# Patient Record
Sex: Female | Born: 2005 | Race: White | Hispanic: Yes | Marital: Single | Smoking: Never smoker
Health system: Southern US, Community
[De-identification: ages and names within clinical notes are randomized; demographics above are authoritative.]

## PROBLEM LIST (undated history)

## (undated) DIAGNOSIS — K5900 Constipation, unspecified: Secondary | ICD-10-CM

## (undated) HISTORY — DX: Constipation, unspecified: K59.00

---
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
## 2024-01-19 ENCOUNTER — Other Ambulatory Visit: Payer: Self-pay

## 2024-01-19 ENCOUNTER — Encounter (HOSPITAL_BASED_OUTPATIENT_CLINIC_OR_DEPARTMENT_OTHER): Payer: Self-pay

## 2024-01-19 ENCOUNTER — Emergency Department (HOSPITAL_BASED_OUTPATIENT_CLINIC_OR_DEPARTMENT_OTHER)
Admission: EM | Admit: 2024-01-19 | Discharge: 2024-01-19 | Disposition: A | Discharge status: Home / Self Care | Payer: Self-pay | Attending: Emergency Medicine | Admitting: Emergency Medicine

## 2024-01-19 DIAGNOSIS — M545 Low back pain, unspecified: Secondary | ICD-10-CM | POA: Insufficient documentation

## 2024-01-19 MED ORDER — CYCLOBENZAPRINE HCL 10 MG PO TABS
10.0000 mg | ORAL_TABLET | Freq: Once | ORAL | Status: AC
  Administered 2024-01-19: 10 mg via ORAL
  Filled 2024-01-19: qty 1

## 2024-01-19 MED ORDER — CELECOXIB 200 MG PO CAPS: 200.0000 mg | ORAL_CAPSULE | Freq: Two times a day (BID) | ORAL | 0 refills | Status: AC | PRN

## 2024-01-19 MED ORDER — KETOROLAC TROMETHAMINE 30 MG/ML IJ SOLN
30.0000 mg | Freq: Once | INTRAMUSCULAR | Status: AC
  Administered 2024-01-19: 30 mg via INTRAMUSCULAR
  Filled 2024-01-19: qty 1

## 2024-01-19 MED ORDER — CYCLOBENZAPRINE HCL 10 MG PO TABS: 10.0000 mg | ORAL_TABLET | Freq: Two times a day (BID) | ORAL | 0 refills | Status: AC | PRN

## 2024-01-19 NOTE — Discharge Instructions (Addendum)
 As discussed, I suspect that your back pain is likely muscular in etiology.  Will send you home with an anti-inflammatory as well as a muscle relaxer to use for your symptoms.  Note the muscle laxer can cause drowsiness so please do not drive or perform any high risk activity and utilize its effects on you.  See information attached your discharge papers regarding stretching exercises.  Recommend follow-up with your primary care for reassessment of your symptoms.  Please do not hesitate to return to emergency department if the worrisome signs and symptoms we discussed become apparent.

## 2024-01-19 NOTE — ED Notes (Signed)
 Pt to rm 5 with steady gait for worsening back pain that started this morning after coughing. Denies numbness/tingling to all extremities. No loss of bowel/bladder function.

## 2024-01-19 NOTE — ED Provider Notes (Signed)
 [place] EMERGENCY DEPARTMENT AT [facility] Provider Note   CSN: [id number] Arrival date & time: 01/19/24  2237     Patient presents with: Back Pain   Tiffany Harper is a 18 y.o. female.    Back Pain   18 year old female presents emergency department complaint of low back pain.  States that she was brushing her teeth and coughed causing a spasming type sensation in her low back.  States that she feels this more in her left side than her right side.  Denies any subsequent trauma/falls.  Denies any saddle anesthesia, bowel/bladder dysfunction, weakness/sensory deficits of lower extremities, fever, history of IV drug use, known malignancy, chronic corticosteroid use.  States that symptoms just occurred prior to arrival.  Has taken nothing for his symptoms.  Patient states that she is currently on her menstrual cycle and is not concerned for pregnancy.  Denies any abdominal pain, urinary symptoms.  Past medical history significant for eczema, chronic left patient  Prior to Admission medications   Medication Sig Start Date End Date Taking? Authorizing Provider  polyethylene glycol powder (GLYCOLAX /MIRALAX ) powder Take 1 capful dissolved in 8-12 ounces of clear, non-carbonated liquid (Water, Gatorade) by mouth daily. May titrate dose, as needed, for effect. 11/12/16   Jakie Mariel Boon, NP    Allergies: Patient has no known allergies.    Review of Systems  Musculoskeletal:  Positive for back pain.  All other systems reviewed and are negative.   Updated Vital Signs BP 125/66 (BP Location: Right Arm)   Pulse 66   Temp 98.4 F (36.9 C) (Oral)   Resp 16   Ht 5' 3 (1.6 m)   Wt 59.9 kg   LMP 01/17/2024 (Exact Date)   SpO2 100%   BMI 23.38 kg/m   Physical Exam Vitals and nursing note reviewed.  Constitutional:      General: She is not in acute distress.    Appearance: She is well-developed.  HENT:     Head: Normocephalic and atraumatic.  Eyes:      Conjunctiva/sclera: Conjunctivae normal.  Cardiovascular:     Rate and Rhythm: Normal rate and regular rhythm.     Heart sounds: No murmur heard. Pulmonary:     Effort: Pulmonary effort is normal. No respiratory distress.     Breath sounds: Normal breath sounds. No wheezing, rhonchi or rales.  Abdominal:     Palpations: Abdomen is soft.     Tenderness: There is no abdominal tenderness. There is no guarding.  Musculoskeletal:        General: No swelling.     Cervical back: Neck supple.     Comments: No midline tenderness lumbar spine.  Paraspinal tenderness left greater than right lower lumbar/buttock region.  Muscular strength 5 out of 5 lower extremities.  No sensory deficits along major nerve attributions of lower extremities.  DTRs symmetric at patella as well as Achilles.  Pedal and posttibial pulses 2+ bilaterally.  Skin:    General: Skin is warm and dry.     Capillary Refill: Capillary refill takes less than 2 seconds.  Neurological:     Mental Status: She is alert.  Psychiatric:        Mood and Affect: Mood normal.     (all labs ordered are listed, but only abnormal results are displayed) Labs Reviewed - No data to display  EKG: None  Radiology: No results found.   Procedures   Medications Ordered in the ED  ketorolac  (TORADOL ) 30 MG/ML injection 30  mg (has no administration in time range)  cyclobenzaprine  (FLEXERIL ) tablet 10 mg (has no administration in time range)                                    Medical Decision Making Risk Prescription drug management.   This patient presents to the ED for concern of back pain, this involves an extensive number of treatment options, and is a complaint that carries with it a high risk of complications and morbidity.  The differential diagnosis includes strain/pain, dislocation, AAA, aortic dissection, pyonephritis, nephrolithiasis, fracture, other   Co morbidities that complicate the patient evaluation  See  HPI   Additional history obtained:  Additional history obtained from EMR External records from outside source obtained and reviewed including [facility] records   Lab Tests:  N/a   Imaging Studies ordered:  N/a   Cardiac Monitoring: / EKG:  N/a   Consultations Obtained:  N/a   Problem List / ED Course / Critical interventions / Medication management  Low back pain I ordered medication including Toradol , Flexeril    Reevaluation of the patient after these medicines showed that the patient improved I have reviewed the patients home medicines and have made adjustments as needed   Social Determinants of Health:  Denies tobacco, licit drug use.   Test / Admission - Considered:  Low back pain Vitals signs within normal range and stable throughout visit. 18 year old female presents emergency department complaint of low back pain.  States that she was brushing her teeth and coughed causing a spasming type sensation in her low back.  States that she feels this more in her left side than her right side.  Denies any subsequent trauma/falls.  Denies any saddle anesthesia, bowel/bladder dysfunction, weakness/sensory deficits of lower extremities, fever, history of IV drug use, known malignancy, chronic corticosteroid use.  States that symptoms just occurred prior to arrival.  Has taken nothing for his symptoms.  Patient states that she is currently on her menstrual cycle and is not concerned for pregnancy.  Denies any abdominal pain, urinary symptoms. On exam, paraspinal tenderness left greater than right lower lumbar/buttock region.  No red flag signs or back pain on HPI/PE; low suspicion for cauda equina, spinal epidural abscess, other spinal cord compression/impingement.  Patient without abdominal pain rating to back, pulse deficits, neurodeficits; low suspicion for aortic dissection.  Patient without any urinary symptoms, intra-abdominal radiation of pain; low suspicion for  pyelonephritis, nephrolithiasis.  Suspect muscular etiology of symptoms.  Treated with medications and did note improvement of symptoms on the ED.  Will treat with similar medications in the outpatient setting as well as recommend rehab exercises.  Close follow-up with PCP recommended for reevaluation.  Treatment plan discussed with patient and treatment understanding was agreeable to said plan.  Patient overall well-appearing, afebrile in no acute distress. Worrisome signs and symptoms were discussed with the patient, and the patient acknowledged understanding to return to the ED if noticed. Patient was stable upon discharge.       Final diagnoses:  Acute bilateral low back pain without sciatica    ED Discharge Orders     None          Silver Wonda LABOR, [place] 01/19/24 2259    Elnor Hila P, DO 01/27/24 1533

## 2024-01-19 NOTE — ED Triage Notes (Signed)
 Pt states she began having back pain after coughing real hard.  States the pain took her to the floor.   Has had nothing OTC for pain
